# Patient Record
Sex: Female | Born: 1945 | Race: White | Hispanic: No | Smoking: Never smoker
Health system: Southern US, Community
[De-identification: ages and names within clinical notes are randomized; demographics above are authoritative.]

## PROBLEM LIST (undated history)

## (undated) DIAGNOSIS — C801 Malignant (primary) neoplasm, unspecified: Secondary | ICD-10-CM

## (undated) DIAGNOSIS — F32A Depression, unspecified: Secondary | ICD-10-CM

## (undated) DIAGNOSIS — F329 Major depressive disorder, single episode, unspecified: Secondary | ICD-10-CM

## (undated) DIAGNOSIS — M1990 Unspecified osteoarthritis, unspecified site: Secondary | ICD-10-CM

## (undated) DIAGNOSIS — R52 Pain, unspecified: Secondary | ICD-10-CM

## (undated) HISTORY — PX: ABDOMINAL HYSTERECTOMY: SHX81

## (undated) HISTORY — PX: HERNIA REPAIR: SHX51

## (undated) HISTORY — PX: FRACTURE SURGERY: SHX138

## (undated) HISTORY — PX: FOOT SURGERY: SHX648

---

## 1992-09-13 HISTORY — PX: BREAST CYST ASPIRATION: SHX578

## 2004-11-19 ENCOUNTER — Ambulatory Visit: Payer: Self-pay | Admitting: Internal Medicine

## 2004-11-24 ENCOUNTER — Ambulatory Visit: Payer: Self-pay | Admitting: Internal Medicine

## 2005-07-14 ENCOUNTER — Ambulatory Visit: Payer: Self-pay | Admitting: Unknown Physician Specialty

## 2006-05-10 ENCOUNTER — Ambulatory Visit: Payer: Self-pay | Admitting: Internal Medicine

## 2007-09-13 ENCOUNTER — Ambulatory Visit: Payer: Self-pay | Admitting: Internal Medicine

## 2008-10-17 ENCOUNTER — Ambulatory Visit: Payer: Self-pay | Admitting: Internal Medicine

## 2009-05-17 ENCOUNTER — Emergency Department: Payer: Self-pay | Admitting: Emergency Medicine

## 2009-05-23 ENCOUNTER — Ambulatory Visit: Payer: Self-pay | Admitting: Unknown Physician Specialty

## 2009-10-21 ENCOUNTER — Ambulatory Visit: Payer: Self-pay | Admitting: Internal Medicine

## 2010-08-27 ENCOUNTER — Encounter
Admission: RE | Admit: 2010-08-27 | Discharge: 2010-08-27 | Payer: Self-pay | Source: Home / Self Care | Attending: Unknown Physician Specialty | Admitting: Unknown Physician Specialty

## 2010-09-02 ENCOUNTER — Encounter
Admission: RE | Admit: 2010-09-02 | Discharge: 2010-09-02 | Payer: Self-pay | Source: Home / Self Care | Attending: Unknown Physician Specialty | Admitting: Unknown Physician Specialty

## 2010-10-22 ENCOUNTER — Ambulatory Visit: Payer: Self-pay | Admitting: Internal Medicine

## 2010-11-02 ENCOUNTER — Ambulatory Visit: Payer: Self-pay | Admitting: Internal Medicine

## 2010-12-14 ENCOUNTER — Other Ambulatory Visit: Payer: Self-pay | Admitting: Unknown Physician Specialty

## 2010-12-14 DIAGNOSIS — M542 Cervicalgia: Secondary | ICD-10-CM

## 2010-12-15 ENCOUNTER — Ambulatory Visit
Admission: RE | Admit: 2010-12-15 | Discharge: 2010-12-15 | Disposition: A | Discharge status: Home / Self Care | Payer: BC Managed Care – PPO | Source: Ambulatory Visit | Attending: *Deleted | Admitting: *Deleted

## 2010-12-15 DIAGNOSIS — M542 Cervicalgia: Secondary | ICD-10-CM

## 2011-12-22 ENCOUNTER — Ambulatory Visit: Payer: Self-pay | Admitting: Internal Medicine

## 2013-02-14 ENCOUNTER — Ambulatory Visit: Payer: Self-pay | Admitting: Internal Medicine

## 2014-03-18 ENCOUNTER — Ambulatory Visit: Payer: Self-pay | Admitting: Family Medicine

## 2014-04-19 ENCOUNTER — Ambulatory Visit: Payer: Self-pay

## 2014-04-22 ENCOUNTER — Ambulatory Visit: Payer: Self-pay | Admitting: Unknown Physician Specialty

## 2014-04-24 LAB — PATHOLOGY REPORT

## 2015-02-17 ENCOUNTER — Other Ambulatory Visit: Payer: Self-pay | Admitting: Family Medicine

## 2015-02-17 DIAGNOSIS — Z1231 Encounter for screening mammogram for malignant neoplasm of breast: Secondary | ICD-10-CM

## 2015-03-12 ENCOUNTER — Other Ambulatory Visit: Payer: Self-pay | Admitting: Orthopaedic Surgery

## 2015-03-12 DIAGNOSIS — M47817 Spondylosis without myelopathy or radiculopathy, lumbosacral region: Secondary | ICD-10-CM

## 2015-03-20 ENCOUNTER — Other Ambulatory Visit: Payer: Self-pay | Admitting: Family Medicine

## 2015-03-20 ENCOUNTER — Ambulatory Visit
Admission: RE | Admit: 2015-03-20 | Discharge: 2015-03-20 | Disposition: A | Discharge status: Home / Self Care | Payer: Medicare Other | Source: Ambulatory Visit | Attending: Family Medicine | Admitting: Family Medicine

## 2015-03-20 DIAGNOSIS — Z1231 Encounter for screening mammogram for malignant neoplasm of breast: Secondary | ICD-10-CM

## 2015-03-20 HISTORY — DX: Malignant (primary) neoplasm, unspecified: C80.1

## 2015-03-26 ENCOUNTER — Ambulatory Visit
Admission: RE | Admit: 2015-03-26 | Discharge: 2015-03-26 | Disposition: A | Discharge status: Home / Self Care | Payer: Medicare Other | Source: Ambulatory Visit | Attending: Orthopaedic Surgery | Admitting: Orthopaedic Surgery

## 2015-03-26 ENCOUNTER — Other Ambulatory Visit: Payer: Self-pay

## 2015-03-26 DIAGNOSIS — M4806 Spinal stenosis, lumbar region: Secondary | ICD-10-CM | POA: Insufficient documentation

## 2015-03-26 DIAGNOSIS — M47817 Spondylosis without myelopathy or radiculopathy, lumbosacral region: Secondary | ICD-10-CM

## 2015-03-26 DIAGNOSIS — M5126 Other intervertebral disc displacement, lumbar region: Secondary | ICD-10-CM | POA: Insufficient documentation

## 2015-03-26 DIAGNOSIS — M5136 Other intervertebral disc degeneration, lumbar region: Secondary | ICD-10-CM | POA: Diagnosis not present

## 2016-02-23 ENCOUNTER — Other Ambulatory Visit: Payer: Self-pay | Admitting: Family Medicine

## 2016-02-23 DIAGNOSIS — Z1231 Encounter for screening mammogram for malignant neoplasm of breast: Secondary | ICD-10-CM

## 2016-02-26 ENCOUNTER — Encounter: Payer: Self-pay | Admitting: Emergency Medicine

## 2016-02-26 ENCOUNTER — Emergency Department
Admission: EM | Admit: 2016-02-26 | Discharge: 2016-02-26 | Disposition: A | Discharge status: Home / Self Care | Payer: Medicare Other | Attending: Emergency Medicine | Admitting: Emergency Medicine

## 2016-02-26 DIAGNOSIS — Z85828 Personal history of other malignant neoplasm of skin: Secondary | ICD-10-CM | POA: Diagnosis not present

## 2016-02-26 DIAGNOSIS — Y939 Activity, unspecified: Secondary | ICD-10-CM | POA: Insufficient documentation

## 2016-02-26 DIAGNOSIS — Y999 Unspecified external cause status: Secondary | ICD-10-CM | POA: Insufficient documentation

## 2016-02-26 DIAGNOSIS — W01198A Fall on same level from slipping, tripping and stumbling with subsequent striking against other object, initial encounter: Secondary | ICD-10-CM | POA: Diagnosis not present

## 2016-02-26 DIAGNOSIS — S0181XA Laceration without foreign body of other part of head, initial encounter: Secondary | ICD-10-CM | POA: Insufficient documentation

## 2016-02-26 DIAGNOSIS — Y929 Unspecified place or not applicable: Secondary | ICD-10-CM | POA: Insufficient documentation

## 2016-02-26 MED ORDER — SULFAMETHOXAZOLE-TRIMETHOPRIM 800-160 MG PO TABS: 1.0000 | ORAL_TABLET | Freq: Two times a day (BID) | ORAL | Status: AC

## 2016-02-26 NOTE — ED Notes (Signed)
Unable to access e-signature - pt aware

## 2016-02-26 NOTE — ED Notes (Signed)
Pt states hit forehead on mantle last night. States tetanus is utd

## 2016-02-26 NOTE — ED Notes (Signed)
Pt states she tripped over the ironing board and fell hitting her head last night, no loc. Pt noted to have laceration to forehead. No bleeding noted.

## 2016-02-26 NOTE — ED Provider Notes (Signed)
[facility] Emergency Department Provider Note   ____________________________________________  Time seen: Approximately 11:50 AM  I have reviewed the triage vital signs and the nursing notes.   HISTORY  Chief Complaint Head Laceration    HPI Tara Bentley is a 70 y.o. female patient presents with 40 laceration which occurred approximately 18 hours ago. Patient states she tripped and fell hitting her forehead on a metal last night. Patient state there was no LOC and does not have any headache. Patient also denies any vertigo or vision disturbance. Patient say hemorrhaging is controlled with direct pressure. Patient does take aspirin daily. She denies any pain at this time. No other palliative measures this complaint.  Past Medical History  Diagnosis Date  . Cancer ([place])     skin    There are no active problems to display for this patient.   Past Surgical History  Procedure Laterality Date  . Breast cyst aspiration Left 1994    negative    Current Outpatient Rx  Name  Route  Sig  Dispense  Refill  . sulfamethoxazole-trimethoprim (BACTRIM DS,SEPTRA DS) 800-160 MG tablet   Oral   Take 1 tablet by mouth 2 (two) times daily.   20 tablet   0     Allergies Review of patient's allergies indicates no known allergies.  Family History  Problem Relation Age of Onset  . Brain cancer Father 28    Social History Social History  Substance Use Topics  . Smoking status: Never Smoker   . Smokeless tobacco: None  . Alcohol Use: No    Review of Systems Constitutional: No fever/chills Eyes: No visual changes. ENT: No sore throat. Cardiovascular: Denies chest pain. Respiratory: Denies shortness of breath. Gastrointestinal: No abdominal pain.  No nausea, no vomiting.  No diarrhea.  No constipation. Genitourinary: Negative for dysuria. Musculoskeletal: Negative for back pain. Skin: Negative for rash. Forehead laceration Neurological: Negative  for headaches, focal weakness or numbness.  .  ____________________________________________   PHYSICAL EXAM:  VITAL SIGNS: ED Triage Vitals  Enc Vitals Group     BP 02/26/16 1135 145/90 mmHg     Pulse Rate 02/26/16 1135 78     Resp 02/26/16 1135 18     Temp 02/26/16 1135 98.4 F (36.9 C)     Temp Source 02/26/16 1135 Oral     SpO2 02/26/16 1135 97 %     Weight 02/26/16 1135 105 lb (47.628 kg)     Height 02/26/16 1135 5\' 1"  (1.549 m)     Head Cir --      Peak Flow --      Pain Score --      Pain Loc --      Pain Edu? --      Excl. in [place]? --     Constitutional: Alert and oriented. Well appearing and in no acute distress. Eyes: Conjunctivae are normal. PERRL. EOMI. Head: Atraumatic. Nose: No congestion/rhinnorhea. Mouth/Throat: Mucous membranes are moist.  Oropharynx non-erythematous. Neck: No stridor.  *No cervical spine tenderness to palpation. Hematological/Lymphatic/Immunilogical: No cervical lymphadenopathy. Cardiovascular: Normal rate, regular rhythm. Grossly normal heart sounds.  Good peripheral circulation. Respiratory: Normal respiratory effort.  No retractions. Lungs CTAB. Gastrointestinal: Soft and nontender. No distention. No abdominal bruits. No CVA tenderness. Musculoskeletal: No lower extremity tenderness nor edema.  No joint effusions. Neurologic:  Normal speech and language. No gross focal neurologic deficits are appreciated. No gait instability. Skin: 2 cm vertical laceration center forehead. Psychiatric: Mood and affect are  normal. Speech and behavior are normal.  ____________________________________________   LABS (all labs ordered are listed, but only abnormal results are displayed)  Labs Reviewed - No data to display ____________________________________________  EKG   ____________________________________________  RADIOLOGY   ____________________________________________   PROCEDURES  Procedure(s) performed: None  Critical Care  performed: No  ____________________________________________   INITIAL IMPRESSION / ASSESSMENT AND PLAN / ED COURSE  Pertinent labs & imaging results that were available during my care of the patient were reviewed by me and considered in my medical decision making (see chart for details).  Facial laceration greater than 18 hours. Discussed the patient rash and now cannot close wound at this time. Area was cleaned and irrigated and Steri-Strips were applied. Patient given discharge care instructions. Patient a prescription for Bactrim DS. Patient advised to follow-up with family doctor. ____________________________________________   FINAL CLINICAL IMPRESSION(S) / ED DIAGNOSES  Final diagnoses:  Facial laceration, initial encounter      NEW MEDICATIONS STARTED DURING THIS VISIT:  New Prescriptions   SULFAMETHOXAZOLE-TRIMETHOPRIM (BACTRIM DS,SEPTRA DS) 800-160 MG TABLET    Take 1 tablet by mouth 2 (two) times daily.     Note:  This document was prepared using Dragon voice recognition software and may include unintentional dictation errors.    Sable Feil, PA-C 02/26/16 [place], MD 02/26/16 [phone]

## 2016-02-26 NOTE — Discharge Instructions (Signed)
Take medication as directed Facial Laceration A facial laceration is a cut on the face. These injuries can be painful and cause bleeding. Some cuts may need to be closed with stitches (sutures), skin adhesive strips, or wound glue. Cuts usually heal quickly but can leave a scar. It can take 1-2 years for the scar to go away completely. HOME CARE   Only take medicines as told by your doctor.  Follow your doctor's instructions for wound care. For Stitches:  Keep the cut clean and dry.  If you have a bandage (dressing), change it at least once a day. Change the bandage if it gets wet or dirty, or as told by your doctor.  Wash the cut with soap and water 2 times a day. Rinse the cut with water. Pat it dry with a clean towel.  Put a thin layer of medicated cream on the cut as told by your doctor.  You may shower after the first 24 hours. Do not soak the cut in water until the stitches are removed.  Have your stitches removed as told by your doctor.  Do not wear any makeup until a few days after your stitches are removed. For Skin Adhesive Strips:  Keep the cut clean and dry.  Do not get the strips wet. You may take a bath, but be careful to keep the cut dry.  If the cut gets wet, pat it dry with a clean towel.  The strips will fall off on their own. Do not remove the strips that are still stuck to the cut. For Wound Glue:  You may shower or take baths. Do not soak or scrub the cut. Do not swim. Avoid heavy sweating until the glue falls off on its own. After a shower or bath, pat the cut dry with a clean towel.  Do not put medicine or makeup on your cut until the glue falls off.  If you have a bandage, do not put tape over the glue.  Avoid lots of sunlight or tanning lamps until the glue falls off.  The glue will fall off on its own in 5-10 days. Do not pick at the glue. After Healing:  Put sunscreen on the cut for the first year to reduce your scar. GET HELP IF:  You have a  fever. GET HELP RIGHT AWAY IF:   Your cut area gets red, painful, or puffy (swollen).  You see a yellowish-white fluid (pus) coming from the cut.   This information is not intended to replace advice given to you by your health care provider. Make sure you discuss any questions you have with your health care provider.   Document Released: 02/16/2008 Document Revised: 09/20/2014 Document Reviewed: 04/12/2013 Elsevier Interactive Patient Education [employer].

## 2016-03-25 ENCOUNTER — Ambulatory Visit
Admission: RE | Admit: 2016-03-25 | Discharge: 2016-03-25 | Disposition: A | Discharge status: Home / Self Care | Payer: Medicare Other | Source: Ambulatory Visit | Attending: Family Medicine | Admitting: Family Medicine

## 2016-03-25 ENCOUNTER — Other Ambulatory Visit: Payer: Self-pay | Admitting: Family Medicine

## 2016-03-25 DIAGNOSIS — Z1231 Encounter for screening mammogram for malignant neoplasm of breast: Secondary | ICD-10-CM

## 2016-03-25 DIAGNOSIS — R921 Mammographic calcification found on diagnostic imaging of breast: Secondary | ICD-10-CM | POA: Diagnosis not present

## 2016-03-29 ENCOUNTER — Other Ambulatory Visit: Payer: Self-pay | Admitting: Family Medicine

## 2016-03-29 DIAGNOSIS — R921 Mammographic calcification found on diagnostic imaging of breast: Secondary | ICD-10-CM

## 2016-03-29 DIAGNOSIS — N6489 Other specified disorders of breast: Secondary | ICD-10-CM

## 2016-04-01 ENCOUNTER — Ambulatory Visit
Admission: RE | Admit: 2016-04-01 | Discharge: 2016-04-01 | Disposition: A | Discharge status: Home / Self Care | Payer: Medicare Other | Source: Ambulatory Visit | Attending: Family Medicine | Admitting: Family Medicine

## 2016-04-01 DIAGNOSIS — R921 Mammographic calcification found on diagnostic imaging of breast: Secondary | ICD-10-CM | POA: Insufficient documentation

## 2016-04-01 DIAGNOSIS — N6489 Other specified disorders of breast: Secondary | ICD-10-CM

## 2016-04-06 ENCOUNTER — Other Ambulatory Visit: Payer: Self-pay | Admitting: Family Medicine

## 2016-04-06 DIAGNOSIS — N6320 Unspecified lump in the left breast, unspecified quadrant: Secondary | ICD-10-CM

## 2016-04-06 DIAGNOSIS — R921 Mammographic calcification found on diagnostic imaging of breast: Secondary | ICD-10-CM

## 2016-04-08 ENCOUNTER — Other Ambulatory Visit: Payer: PRIVATE HEALTH INSURANCE

## 2016-04-08 ENCOUNTER — Ambulatory Visit: Payer: PRIVATE HEALTH INSURANCE

## 2016-04-14 ENCOUNTER — Ambulatory Visit
Admission: RE | Admit: 2016-04-14 | Discharge: 2016-04-14 | Disposition: A | Discharge status: Home / Self Care | Payer: Medicare Other | Source: Ambulatory Visit | Attending: Family Medicine | Admitting: Family Medicine

## 2016-04-14 ENCOUNTER — Other Ambulatory Visit: Payer: Self-pay | Admitting: Family Medicine

## 2016-04-14 DIAGNOSIS — N6489 Other specified disorders of breast: Secondary | ICD-10-CM | POA: Insufficient documentation

## 2016-04-14 DIAGNOSIS — N6320 Unspecified lump in the left breast, unspecified quadrant: Secondary | ICD-10-CM

## 2016-04-14 DIAGNOSIS — R921 Mammographic calcification found on diagnostic imaging of breast: Secondary | ICD-10-CM | POA: Insufficient documentation

## 2016-08-30 ENCOUNTER — Other Ambulatory Visit: Payer: Self-pay | Admitting: Family Medicine

## 2016-08-30 DIAGNOSIS — R921 Mammographic calcification found on diagnostic imaging of breast: Secondary | ICD-10-CM

## 2016-10-15 ENCOUNTER — Other Ambulatory Visit: Payer: Self-pay | Admitting: Family Medicine

## 2016-10-15 ENCOUNTER — Ambulatory Visit
Admission: RE | Admit: 2016-10-15 | Discharge: 2016-10-15 | Disposition: A | Discharge status: Home / Self Care | Payer: Medicare Other | Source: Ambulatory Visit | Attending: Family Medicine | Admitting: Family Medicine

## 2016-10-15 DIAGNOSIS — R921 Mammographic calcification found on diagnostic imaging of breast: Secondary | ICD-10-CM | POA: Insufficient documentation

## 2016-10-18 ENCOUNTER — Other Ambulatory Visit: Payer: Self-pay | Admitting: Family Medicine

## 2016-10-18 DIAGNOSIS — R928 Other abnormal and inconclusive findings on diagnostic imaging of breast: Secondary | ICD-10-CM

## 2016-10-18 DIAGNOSIS — R921 Mammographic calcification found on diagnostic imaging of breast: Secondary | ICD-10-CM

## 2016-11-03 ENCOUNTER — Ambulatory Visit
Admission: RE | Admit: 2016-11-03 | Discharge: 2016-11-03 | Disposition: A | Discharge status: Home / Self Care | Payer: Medicare Other | Source: Ambulatory Visit | Attending: Family Medicine | Admitting: Family Medicine

## 2016-11-03 DIAGNOSIS — N6012 Diffuse cystic mastopathy of left breast: Secondary | ICD-10-CM | POA: Insufficient documentation

## 2016-11-03 DIAGNOSIS — R928 Other abnormal and inconclusive findings on diagnostic imaging of breast: Secondary | ICD-10-CM

## 2016-11-03 DIAGNOSIS — R921 Mammographic calcification found on diagnostic imaging of breast: Secondary | ICD-10-CM | POA: Diagnosis present

## 2016-11-03 HISTORY — PX: BREAST BIOPSY: SHX20

## 2016-11-04 LAB — SURGICAL PATHOLOGY

## 2017-03-08 ENCOUNTER — Encounter: Payer: Self-pay | Admitting: *Deleted

## 2017-03-15 ENCOUNTER — Ambulatory Visit
Admission: RE | Admit: 2017-03-15 | Discharge: 2017-03-15 | Disposition: A | Discharge status: Home / Self Care | Payer: Medicare Other | Source: Ambulatory Visit | Attending: Ophthalmology | Admitting: Ophthalmology

## 2017-03-15 ENCOUNTER — Encounter: Payer: Self-pay | Admitting: *Deleted

## 2017-03-15 ENCOUNTER — Ambulatory Visit: Payer: Medicare Other | Admitting: Anesthesiology

## 2017-03-15 ENCOUNTER — Encounter
Admission: RE | Disposition: A | Discharge status: Home / Self Care | Payer: Self-pay | Source: Ambulatory Visit | Attending: Ophthalmology

## 2017-03-15 DIAGNOSIS — Z7982 Long term (current) use of aspirin: Secondary | ICD-10-CM | POA: Insufficient documentation

## 2017-03-15 DIAGNOSIS — E7800 Pure hypercholesterolemia, unspecified: Secondary | ICD-10-CM | POA: Insufficient documentation

## 2017-03-15 DIAGNOSIS — F329 Major depressive disorder, single episode, unspecified: Secondary | ICD-10-CM | POA: Insufficient documentation

## 2017-03-15 DIAGNOSIS — Z79899 Other long term (current) drug therapy: Secondary | ICD-10-CM | POA: Insufficient documentation

## 2017-03-15 DIAGNOSIS — H2511 Age-related nuclear cataract, right eye: Secondary | ICD-10-CM | POA: Diagnosis present

## 2017-03-15 HISTORY — DX: Pain, unspecified: R52

## 2017-03-15 HISTORY — PX: CATARACT EXTRACTION W/PHACO: SHX586

## 2017-03-15 HISTORY — DX: Unspecified osteoarthritis, unspecified site: M19.90

## 2017-03-15 HISTORY — DX: Major depressive disorder, single episode, unspecified: F32.9

## 2017-03-15 HISTORY — DX: Depression, unspecified: F32.A

## 2017-03-15 SURGERY — PHACOEMULSIFICATION, CATARACT, WITH IOL INSERTION
Anesthesia: Monitor Anesthesia Care | Site: Eye | Laterality: Right | Wound class: Clean

## 2017-03-15 MED ORDER — POVIDONE-IODINE 5 % OP SOLN
OPHTHALMIC | Status: AC
  Filled 2017-03-15: qty 30

## 2017-03-15 MED ORDER — CARBACHOL 0.01 % IO SOLN
INTRAOCULAR | Status: DC | PRN
  Administered 2017-03-15: .5 mL via INTRAOCULAR

## 2017-03-15 MED ORDER — NA CHONDROIT SULF-NA HYALURON 40-17 MG/ML IO SOLN
INTRAOCULAR | Status: AC
  Filled 2017-03-15: qty 1

## 2017-03-15 MED ORDER — MOXIFLOXACIN HCL 0.5 % OP SOLN: 1.0000 [drp] | OPHTHALMIC | Status: DC | PRN

## 2017-03-15 MED ORDER — FENTANYL CITRATE (PF) 100 MCG/2ML IJ SOLN
INTRAMUSCULAR | Status: AC
  Filled 2017-03-15: qty 2

## 2017-03-15 MED ORDER — EPINEPHRINE PF 1 MG/ML IJ SOLN
INTRAMUSCULAR | Status: AC
  Filled 2017-03-15: qty 1

## 2017-03-15 MED ORDER — MIDAZOLAM HCL 2 MG/2ML IJ SOLN
INTRAMUSCULAR | Status: DC | PRN
  Administered 2017-03-15: 1 mg via INTRAVENOUS

## 2017-03-15 MED ORDER — MOXIFLOXACIN HCL 0.5 % OP SOLN
OPHTHALMIC | Status: AC
  Filled 2017-03-15: qty 3

## 2017-03-15 MED ORDER — FENTANYL CITRATE (PF) 100 MCG/2ML IJ SOLN
INTRAMUSCULAR | Status: DC | PRN
  Administered 2017-03-15: 50 ug via INTRAVENOUS

## 2017-03-15 MED ORDER — NA CHONDROIT SULF-NA HYALURON 40-17 MG/ML IO SOLN
INTRAOCULAR | Status: DC | PRN
Start: 2017-03-15 — End: 2017-03-15
  Administered 2017-03-15: 1 mL via INTRAOCULAR

## 2017-03-15 MED ORDER — ARMC OPHTHALMIC DILATING DROPS
OPHTHALMIC | Status: AC
  Administered 2017-03-15: 1 via OPHTHALMIC
  Filled 2017-03-15: qty 0.4

## 2017-03-15 MED ORDER — SODIUM CHLORIDE 0.9 % IV SOLN
INTRAVENOUS | Status: DC
  Administered 2017-03-15: 11:00:00 via INTRAVENOUS

## 2017-03-15 MED ORDER — ARMC OPHTHALMIC DILATING DROPS
1.0000 "application " | OPHTHALMIC | Status: AC
  Administered 2017-03-15 (×3): 1 via OPHTHALMIC

## 2017-03-15 MED ORDER — LIDOCAINE HCL (PF) 4 % IJ SOLN
INTRAOCULAR | Status: DC | PRN
  Administered 2017-03-15: 2 mL via OPHTHALMIC

## 2017-03-15 MED ORDER — LIDOCAINE HCL (PF) 4 % IJ SOLN
INTRAMUSCULAR | Status: AC
  Filled 2017-03-15: qty 5

## 2017-03-15 MED ORDER — MIDAZOLAM HCL 2 MG/2ML IJ SOLN
INTRAMUSCULAR | Status: AC
  Filled 2017-03-15: qty 2

## 2017-03-15 MED ORDER — MOXIFLOXACIN HCL 0.5 % OP SOLN
OPHTHALMIC | Status: DC | PRN
Start: 2017-03-15 — End: 2017-03-15
  Administered 2017-03-15: .2 mL via OPHTHALMIC

## 2017-03-15 MED ORDER — BSS IO SOLN
INTRAOCULAR | Status: DC | PRN
  Administered 2017-03-15: 1 mL via OPHTHALMIC

## 2017-03-15 MED ORDER — POVIDONE-IODINE 5 % OP SOLN
OPHTHALMIC | Status: DC | PRN
Start: 2017-03-15 — End: 2017-03-15
  Administered 2017-03-15: 1 via OPHTHALMIC

## 2017-03-15 SURGICAL SUPPLY — 14 items
GLOVE BIO SURGEON STRL SZ8 (GLOVE) ×3 IMPLANT
GLOVE BIOGEL M 6.5 STRL (GLOVE) ×3 IMPLANT
GLOVE SURG LX 8.0 MICRO (GLOVE) ×2
GLOVE SURG LX STRL 8.0 MICRO (GLOVE) ×1 IMPLANT
GOWN STRL REUS W/ TWL LRG LVL3 (GOWN DISPOSABLE) ×2 IMPLANT
GOWN STRL REUS W/TWL LRG LVL3 (GOWN DISPOSABLE) ×4
LENS IOL TECNIS ITEC 15.5 (Intraocular Lens) ×3 IMPLANT
PACK CATARACT (MISCELLANEOUS) ×3 IMPLANT
PACK CATARACT BRASINGTON LX (MISCELLANEOUS) ×3 IMPLANT
SOL BSS BAG (MISCELLANEOUS) ×3
SOLUTION BSS BAG (MISCELLANEOUS) ×1 IMPLANT
SYR 5ML LL (SYRINGE) ×3 IMPLANT
WATER STERILE IRR 250ML POUR (IV SOLUTION) ×3 IMPLANT
WIPE NON LINTING 3.25X3.25 (MISCELLANEOUS) ×3 IMPLANT

## 2017-03-15 NOTE — Op Note (Signed)
PREOPERATIVE DIAGNOSIS:  Nuclear sclerotic cataract of the right eye.   POSTOPERATIVE DIAGNOSIS:  nuclear sclerotic cataract right eye   OPERATIVE PROCEDURE: Procedure(s): CATARACT EXTRACTION PHACO AND INTRAOCULAR LENS PLACEMENT (IOC)   SURGEON:  Birder Robson, MD.   ANESTHESIA:  Anesthesiologist: Piscitello, Precious Haws, MD CRNA: Hedda Slade, CRNA  1.      Managed anesthesia care. 2.      0.25ml of Shugarcaine was instilled in the eye following the paracentesis.   COMPLICATIONS:  None.   TECHNIQUE:   Stop and chop   DESCRIPTION OF PROCEDURE:  The patient was examined and consented in the preoperative holding area where the aforementioned topical anesthesia was applied to the right eye and then brought back to the Operating Room where the right eye was prepped and draped in the usual sterile ophthalmic fashion and a lid speculum was placed. A paracentesis was created with the side port blade and the anterior chamber was filled with viscoelastic. A near clear corneal incision was performed with the steel keratome. A continuous curvilinear capsulorrhexis was performed with a cystotome followed by the capsulorrhexis forceps. Hydrodissection and hydrodelineation were carried out with BSS on a blunt cannula. The lens was removed in a stop and chop  technique and the remaining cortical material was removed with the irrigation-aspiration handpiece. The capsular bag was inflated with viscoelastic and the Technis ZCB00  lens was placed in the capsular bag without complication. The remaining viscoelastic was removed from the eye with the irrigation-aspiration handpiece. The wounds were hydrated. The anterior chamber was flushed with Miostat and the eye was inflated to physiologic pressure. 0.4ml of Vigamox was placed in the anterior chamber. The wounds were found to be water tight. The eye was dressed with Vigamox. The patient was given protective glasses to wear throughout the day and a shield with which  to sleep tonight. The patient was also given drops with which to begin a drop regimen today and will follow-up with me in one day.  Implant Name Type Inv. Item Serial No. Manufacturer Lot No. LRB No. Used  LENS IOL DIOP 15.5 - [id number] Intraocular Lens LENS IOL DIOP 15.5 [id number]   Right 1   Procedure(s) with comments: CATARACT EXTRACTION PHACO AND INTRAOCULAR LENS PLACEMENT (IOC) (Right) - [place] 00:36 AP% 14.1 CDE 5.06 Fluid pack lot # [id number]  Electronically signed: [place] 03/15/2017 12:29 PM

## 2017-03-15 NOTE — Anesthesia Postprocedure Evaluation (Signed)
Anesthesia Post Note  Patient: Tara Bentley  Procedure(s) Performed: Procedure(s) (LRB): CATARACT EXTRACTION PHACO AND INTRAOCULAR LENS PLACEMENT (IOC) (Right)  Patient location during evaluation: PACU Anesthesia Type: MAC Level of consciousness: awake Pain management: pain level controlled Vital Signs Assessment: post-procedure vital signs reviewed and stable Respiratory status: spontaneous breathing Cardiovascular status: blood pressure returned to baseline Postop Assessment: no signs of nausea or vomiting Anesthetic complications: no     Last Vitals:  Vitals:   03/15/17 1104 03/15/17 1231  BP: 124/71 128/71  Pulse: 72 70  Resp: 20 12  Temp: (!) 35.9 C 37 C    Last Pain:  Vitals:   03/15/17 1231  TempSrc: Oral                 Hedda Slade

## 2017-03-15 NOTE — H&P (Signed)
All labs reviewed. Abnormal studies sent to patients PCP when indicated.  Previous H&P reviewed, patient examined, there are NO CHANGES.  Tara Bentley LOUIS7/3/201812:05 PM

## 2017-03-15 NOTE — Discharge Instructions (Signed)
Eye Surgery Discharge Instructions  Expect mild scratchy sensation or mild soreness. DO NOT RUB YOUR EYE!  The day of surgery:  Minimal physical activity, but bed rest is not required  No reading, computer work, or close hand work  No bending, lifting, or straining.  May watch TV  For 24 hours:  No driving, legal decisions, or alcoholic beverages  Safety precautions  Eat anything you prefer: It is better to start with liquids, then soup then solid foods.  _____ Eye patch should be worn until postoperative exam tomorrow.  ____ Solar shield eyeglasses should be worn for comfort in the sunlight/patch while sleeping  Resume all regular medications including aspirin or Coumadin if these were discontinued prior to surgery. You may shower, bathe, shave, or wash your hair. Tylenol may be taken for mild discomfort.  Call your doctor if you experience significant pain, nausea, or vomiting, fever > 101 or other signs of infection. [phone] or [phone] Specific instructions:  Follow-up Information    Birder Robson, MD Follow up on 03/15/2017.   Specialty:  Ophthalmology Why:  follow up at 4:10 pm today Contact information: [place] [address] [phone]

## 2017-03-15 NOTE — Anesthesia Post-op Follow-up Note (Cosign Needed)
Anesthesia QCDR form completed.        

## 2017-03-15 NOTE — Transfer of Care (Signed)
Immediate Anesthesia Transfer of Care Note  Patient: Tara Bentley  Procedure(s) Performed: Procedure(s) with comments: CATARACT EXTRACTION PHACO AND INTRAOCULAR LENS PLACEMENT (IOC) (Right) - [place] 00:36 AP% 14.1 CDE 5.06 Fluid pack lot # [id number]  Patient Location: PACU  Anesthesia Type:MAC  Level of Consciousness: awake, alert  and oriented  Airway & Oxygen Therapy: Patient Spontanous Breathing  Post-op Assessment: Report given to RN and Post -op Vital signs reviewed and stable  Post vital signs: Reviewed and stable  Last Vitals:  Vitals:   03/15/17 1104 03/15/17 1231  BP: 124/71 128/71  Pulse: 72 70  Resp: 20 12  Temp: (!) 35.9 C 37 C    Last Pain:  Vitals:   03/15/17 1231  TempSrc: Oral         Complications: No apparent anesthesia complications

## 2017-03-15 NOTE — Anesthesia Preprocedure Evaluation (Signed)
Anesthesia Evaluation  Patient identified by MRN, date of birth, ID band Patient awake    Reviewed: Allergy & Precautions, H&P , NPO status , Patient's Chart, lab work & pertinent test results  Airway Mallampati: III  TM Distance: <3 FB Neck ROM: limited    Dental  (+) Chipped, Caps   Pulmonary neg pulmonary ROS, neg shortness of breath,           Cardiovascular Exercise Tolerance: Good (-) angina(-) Past MI negative cardio ROS       Neuro/Psych PSYCHIATRIC DISORDERS Depression negative neurological ROS  negative psych ROS   GI/Hepatic negative GI ROS, Neg liver ROS, neg GERD  ,  Endo/Other  negative endocrine ROS  Renal/GU      Musculoskeletal  (+) Arthritis ,   Abdominal   Peds  Hematology negative hematology ROS (+)   Anesthesia Other Findings Past Medical History: No date: Arthritis No date: Cancer ([place])     Comment: skin No date: Depression No date: Pain     Comment: CHRONIC BACK  Past Surgical History: No date: ABDOMINAL HYSTERECTOMY 11/03/2016: BREAST BIOPSY Left     Comment: stereoe path pending 1994: BREAST CYST ASPIRATION Left     Comment: negative No date: FRACTURE SURGERY     Comment: RT ARM No date: HERNIA REPAIR  BMI    Body Mass Index:  20.41 kg/m      Reproductive/Obstetrics negative OB ROS                             Anesthesia Physical Anesthesia Plan  ASA: II  Anesthesia Plan: MAC   Post-op Pain Management:    Induction: Intravenous  PONV Risk Score and Plan:   Airway Management Planned: Natural Airway and Nasal Cannula  Additional Equipment:   Intra-op Plan:   Post-operative Plan:   Informed Consent: I have reviewed the patients History and Physical, chart, labs and discussed the procedure including the risks, benefits and alternatives for the proposed anesthesia with the patient or authorized representative who has indicated his/her  understanding and acceptance.   Dental Advisory Given  Plan Discussed with: Anesthesiologist, CRNA and Surgeon  Anesthesia Plan Comments: (Patient consented for risks of anesthesia including but not limited to:  - adverse reactions to medications - damage to teeth, lips or other oral mucosa - sore throat or hoarseness - Damage to heart, brain, lungs or loss of life  Patient voiced understanding.)        Anesthesia Quick Evaluation

## 2017-04-06 ENCOUNTER — Encounter: Payer: Self-pay | Admitting: *Deleted

## 2017-04-12 ENCOUNTER — Ambulatory Visit
Admission: RE | Admit: 2017-04-12 | Discharge: 2017-04-12 | Disposition: A | Discharge status: Home / Self Care | Payer: Medicare Other | Source: Ambulatory Visit | Attending: Ophthalmology | Admitting: Ophthalmology

## 2017-04-12 ENCOUNTER — Encounter: Payer: Self-pay | Admitting: *Deleted

## 2017-04-12 ENCOUNTER — Encounter
Admission: RE | Disposition: A | Discharge status: Home / Self Care | Payer: Self-pay | Source: Ambulatory Visit | Attending: Ophthalmology

## 2017-04-12 ENCOUNTER — Ambulatory Visit: Payer: Medicare Other | Admitting: Anesthesiology

## 2017-04-12 DIAGNOSIS — Z85828 Personal history of other malignant neoplasm of skin: Secondary | ICD-10-CM | POA: Insufficient documentation

## 2017-04-12 DIAGNOSIS — F329 Major depressive disorder, single episode, unspecified: Secondary | ICD-10-CM | POA: Diagnosis not present

## 2017-04-12 DIAGNOSIS — G8929 Other chronic pain: Secondary | ICD-10-CM | POA: Insufficient documentation

## 2017-04-12 DIAGNOSIS — M1990 Unspecified osteoarthritis, unspecified site: Secondary | ICD-10-CM | POA: Diagnosis not present

## 2017-04-12 DIAGNOSIS — H2512 Age-related nuclear cataract, left eye: Secondary | ICD-10-CM | POA: Diagnosis present

## 2017-04-12 HISTORY — PX: CATARACT EXTRACTION W/PHACO: SHX586

## 2017-04-12 SURGERY — PHACOEMULSIFICATION, CATARACT, WITH IOL INSERTION
Anesthesia: Monitor Anesthesia Care | Site: Eye | Laterality: Left | Wound class: Clean

## 2017-04-12 MED ORDER — FENTANYL CITRATE (PF) 100 MCG/2ML IJ SOLN
INTRAMUSCULAR | Status: AC
  Filled 2017-04-12: qty 2

## 2017-04-12 MED ORDER — MIDAZOLAM HCL 2 MG/2ML IJ SOLN
INTRAMUSCULAR | Status: DC | PRN
Start: 2017-04-12 — End: 2017-04-12
  Administered 2017-04-12: 0.5 mg via INTRAVENOUS

## 2017-04-12 MED ORDER — SODIUM CHLORIDE 0.9 % IV SOLN
INTRAVENOUS | Status: DC
  Administered 2017-04-12: 09:00:00 via INTRAVENOUS

## 2017-04-12 MED ORDER — FENTANYL CITRATE (PF) 100 MCG/2ML IJ SOLN
INTRAMUSCULAR | Status: DC | PRN
  Administered 2017-04-12: 50 ug via INTRAVENOUS

## 2017-04-12 MED ORDER — MOXIFLOXACIN HCL 0.5 % OP SOLN: 1.0000 [drp] | OPHTHALMIC | Status: DC | PRN

## 2017-04-12 MED ORDER — CARBACHOL 0.01 % IO SOLN
INTRAOCULAR | Status: DC | PRN
  Administered 2017-04-12: .5 mL via INTRAOCULAR

## 2017-04-12 MED ORDER — NA CHONDROIT SULF-NA HYALURON 40-17 MG/ML IO SOLN
INTRAOCULAR | Status: DC | PRN
  Administered 2017-04-12: 1 mL via INTRAOCULAR

## 2017-04-12 MED ORDER — EPINEPHRINE PF 1 MG/ML IJ SOLN
INTRAOCULAR | Status: DC | PRN
  Administered 2017-04-12: 1 mL via OPHTHALMIC

## 2017-04-12 MED ORDER — MOXIFLOXACIN HCL 0.5 % OP SOLN
OPHTHALMIC | Status: AC
  Filled 2017-04-12: qty 3

## 2017-04-12 MED ORDER — LIDOCAINE HCL (PF) 4 % IJ SOLN
INTRAMUSCULAR | Status: DC | PRN
  Administered 2017-04-12: 2 mL via OPHTHALMIC

## 2017-04-12 MED ORDER — MOXIFLOXACIN HCL 0.5 % OP SOLN
OPHTHALMIC | Status: DC | PRN
  Administered 2017-04-12: .2 mL via OPHTHALMIC

## 2017-04-12 MED ORDER — POVIDONE-IODINE 5 % OP SOLN
OPHTHALMIC | Status: DC | PRN
  Administered 2017-04-12: 1 via OPHTHALMIC

## 2017-04-12 MED ORDER — MIDAZOLAM HCL 2 MG/2ML IJ SOLN
INTRAMUSCULAR | Status: AC
  Filled 2017-04-12: qty 2

## 2017-04-12 MED ORDER — ARMC OPHTHALMIC DILATING DROPS
1.0000 "application " | OPHTHALMIC | Status: AC
  Administered 2017-04-12 (×2): 1 via OPHTHALMIC

## 2017-04-12 MED ORDER — ARMC OPHTHALMIC DILATING DROPS
OPHTHALMIC | Status: AC
  Administered 2017-04-12: 1 via OPHTHALMIC
  Filled 2017-04-12: qty 0.4

## 2017-04-12 SURGICAL SUPPLY — 16 items
GLOVE BIO SURGEON STRL SZ8 (GLOVE) ×3 IMPLANT
GLOVE BIOGEL M 6.5 STRL (GLOVE) ×3 IMPLANT
GLOVE SURG LX 8.0 MICRO (GLOVE) ×2
GLOVE SURG LX STRL 8.0 MICRO (GLOVE) ×1 IMPLANT
GOWN STRL REUS W/ TWL LRG LVL3 (GOWN DISPOSABLE) ×2 IMPLANT
GOWN STRL REUS W/TWL LRG LVL3 (GOWN DISPOSABLE) ×4
LABEL CATARACT MEDS ST (LABEL) ×3 IMPLANT
LENS IOL TECNIS ITEC 15.5 (Intraocular Lens) ×3 IMPLANT
PACK CATARACT (MISCELLANEOUS) ×3 IMPLANT
PACK CATARACT BRASINGTON LX (MISCELLANEOUS) ×3 IMPLANT
PACK EYE AFTER SURG (MISCELLANEOUS) ×3 IMPLANT
SOL BSS BAG (MISCELLANEOUS) ×3
SOLUTION BSS BAG (MISCELLANEOUS) ×1 IMPLANT
SYR 5ML LL (SYRINGE) ×3 IMPLANT
WATER STERILE IRR 250ML POUR (IV SOLUTION) ×3 IMPLANT
WIPE NON LINTING 3.25X3.25 (MISCELLANEOUS) ×3 IMPLANT

## 2017-04-12 NOTE — Anesthesia Post-op Follow-up Note (Cosign Needed)
Anesthesia QCDR form completed.        

## 2017-04-12 NOTE — Anesthesia Postprocedure Evaluation (Signed)
Anesthesia Post Note  Patient: Tara Bentley  Procedure(s) Performed: Procedure(s) (LRB): CATARACT EXTRACTION PHACO AND INTRAOCULAR LENS PLACEMENT (IOC) (Left)  Patient location during evaluation: PACU Anesthesia Type: MAC Level of consciousness: awake and alert Pain management: pain level controlled Vital Signs Assessment: post-procedure vital signs reviewed and stable Respiratory status: spontaneous breathing, nonlabored ventilation, respiratory function stable and patient connected to nasal cannula oxygen Cardiovascular status: stable and blood pressure returned to baseline Anesthetic complications: no     Last Vitals:  Vitals:   04/12/17 1029 04/12/17 1033  BP: 130/74 130/74  Pulse: 73 76  Resp: 16 16  Temp: 36.9 C     Last Pain:  Vitals:   04/12/17 1033  TempSrc: Oral  PainSc: 0-No pain                 Darlyne Russian

## 2017-04-12 NOTE — Op Note (Signed)
PREOPERATIVE DIAGNOSIS:  Nuclear sclerotic cataract of the left eye.   POSTOPERATIVE DIAGNOSIS:  Nuclear sclerotic cataract of the left eye.   OPERATIVE PROCEDURE: Procedure(s): CATARACT EXTRACTION PHACO AND INTRAOCULAR LENS PLACEMENT (IOC)   SURGEON:  Birder Robson, MD.   ANESTHESIA:  Anesthesiologist: Piscitello, Precious Haws, MD CRNA: Darlyne Russian, CRNA  1.      Managed anesthesia care. 2.     0.33ml of Shugarcaine was instilled following the paracentesis   COMPLICATIONS:  None.   TECHNIQUE:   Stop and chop   DESCRIPTION OF PROCEDURE:  The patient was examined and consented in the preoperative holding area where the aforementioned topical anesthesia was applied to the left eye and then brought back to the Operating Room where the left eye was prepped and draped in the usual sterile ophthalmic fashion and a lid speculum was placed. A paracentesis was created with the side port blade and the anterior chamber was filled with viscoelastic. A near clear corneal incision was performed with the steel keratome. A continuous curvilinear capsulorrhexis was performed with a cystotome followed by the capsulorrhexis forceps. Hydrodissection and hydrodelineation were carried out with BSS on a blunt cannula. The lens was removed in a stop and chop  technique and the remaining cortical material was removed with the irrigation-aspiration handpiece. The capsular bag was inflated with viscoelastic and the Technis ZCB00 lens was placed in the capsular bag without complication. The remaining viscoelastic was removed from the eye with the irrigation-aspiration handpiece. The wounds were hydrated. The anterior chamber was flushed with Miostat and the eye was inflated to physiologic pressure. 0.58ml Vigamox was placed in the anterior chamber. The wounds were found to be water tight. The eye was dressed with Vigamox. The patient was given protective glasses to wear throughout the day and a shield with which to sleep  tonight. The patient was also given drops with which to begin a drop regimen today and will follow-up with me in one day.  Implant Name Type Inv. Item Serial No. Manufacturer Lot No. LRB No. Used  LENS IOL DIOP 15.5 - [id number] Intraocular Lens LENS IOL DIOP 15.5 [id number] AMO   Left 1    Procedure(s) with comments: CATARACT EXTRACTION PHACO AND INTRAOCULAR LENS PLACEMENT (IOC) (Left) - [place] 00:30 AP% 18.9 CDE 5.80 fluid pack lot # [id number]  Electronically signed: [place] 04/12/2017 10:30 AM

## 2017-04-12 NOTE — Transfer of Care (Signed)
Immediate Anesthesia Transfer of Care Note  Patient: Tara Bentley  Procedure(s) Performed: Procedure(s) with comments: CATARACT EXTRACTION PHACO AND INTRAOCULAR LENS PLACEMENT (IOC) (Left) - [place] 00:30 AP% 18.9 CDE 5.80 fluid pack lot # [id number]  Patient Location: PACU  Anesthesia Type:MAC  Level of Consciousness: awake, alert  and oriented  Airway & Oxygen Therapy: Patient Spontanous Breathing  Post-op Assessment: Report given to RN and Post -op Vital signs reviewed and stable  Post vital signs: Reviewed and stable  Last Vitals:  Vitals:   04/12/17 1029 04/12/17 1033  BP: 130/74 130/74  Pulse: 73 76  Resp: 16 16  Temp: 36.9 C     Last Pain:  Vitals:   04/12/17 1033  TempSrc: Oral  PainSc: 0-No pain         Complications: No apparent anesthesia complications

## 2017-04-12 NOTE — H&P (Signed)
All labs reviewed. Abnormal studies sent to patients PCP when indicated.  Previous H&P reviewed, patient examined, there are NO CHANGES.  Tara Bentley LOUIS7/31/201810:04 AM

## 2017-04-12 NOTE — Anesthesia Procedure Notes (Signed)
Procedure Name: MAC Date/Time: 04/12/2017 10:11 AM Performed by: Darlyne Russian Pre-anesthesia Checklist: Patient identified, Emergency Drugs available, Suction available, Patient being monitored and Timeout performed Patient Re-evaluated:Patient Re-evaluated prior to induction Oxygen Delivery Method: Nasal cannula Placement Confirmation: positive ETCO2

## 2017-04-12 NOTE — Anesthesia Preprocedure Evaluation (Signed)
Anesthesia Evaluation  Patient identified by MRN, date of birth, ID band Patient awake    Reviewed: Allergy & Precautions, H&P , NPO status , Patient's Chart, lab work & pertinent test results  Airway Mallampati: III  TM Distance: <3 FB Neck ROM: limited    Dental  (+) Chipped, Caps   Pulmonary neg pulmonary ROS, neg shortness of breath,           Cardiovascular Exercise Tolerance: Good (-) angina(-) Past MI negative cardio ROS       Neuro/Psych PSYCHIATRIC DISORDERS Depression negative neurological ROS  negative psych ROS   GI/Hepatic negative GI ROS, Neg liver ROS, neg GERD  ,  Endo/Other  negative endocrine ROS  Renal/GU      Musculoskeletal  (+) Arthritis ,   Abdominal   Peds  Hematology negative hematology ROS (+)   Anesthesia Other Findings Past Medical History: No date: Arthritis No date: Cancer ([facility])     Comment: skin No date: Depression No date: Pain     Comment: CHRONIC BACK  Past Surgical History: No date: ABDOMINAL HYSTERECTOMY 11/03/2016: BREAST BIOPSY Left     Comment: stereoe path pending 1994: BREAST CYST ASPIRATION Left     Comment: negative No date: FRACTURE SURGERY     Comment: RT ARM No date: HERNIA REPAIR  BMI    Body Mass Index:  20.41 kg/m      Reproductive/Obstetrics negative OB ROS                             Anesthesia Physical Anesthesia Plan  ASA: II  Anesthesia Plan: MAC   Post-op Pain Management:    Induction: Intravenous  PONV Risk Score and Plan:   Airway Management Planned: Natural Airway and Nasal Cannula  Additional Equipment:   Intra-op Plan:   Post-operative Plan:   Informed Consent: I have reviewed the patients History and Physical, chart, labs and discussed the procedure including the risks, benefits and alternatives for the proposed anesthesia with the patient or authorized representative who has indicated his/her  understanding and acceptance.   Dental Advisory Given  Plan Discussed with: Anesthesiologist, CRNA and Surgeon  Anesthesia Plan Comments: (Patient consented for risks of anesthesia including but not limited to:  - adverse reactions to medications - damage to teeth, lips or other oral mucosa - sore throat or hoarseness - Damage to heart, brain, lungs or loss of life  Patient voiced understanding.)        Anesthesia Quick Evaluation  

## 2017-04-12 NOTE — Discharge Instructions (Signed)

## 2017-09-27 ENCOUNTER — Other Ambulatory Visit: Payer: Self-pay | Admitting: Family Medicine

## 2017-09-27 DIAGNOSIS — Z1231 Encounter for screening mammogram for malignant neoplasm of breast: Secondary | ICD-10-CM

## 2017-10-13 ENCOUNTER — Ambulatory Visit
Admission: RE | Admit: 2017-10-13 | Discharge: 2017-10-13 | Disposition: A | Discharge status: Home / Self Care | Payer: Medicare Other | Source: Ambulatory Visit | Attending: Family Medicine | Admitting: Family Medicine

## 2017-10-13 DIAGNOSIS — Z1231 Encounter for screening mammogram for malignant neoplasm of breast: Secondary | ICD-10-CM | POA: Diagnosis present

## 2018-09-04 ENCOUNTER — Other Ambulatory Visit: Payer: Self-pay | Admitting: Family Medicine

## 2018-09-04 DIAGNOSIS — Z1231 Encounter for screening mammogram for malignant neoplasm of breast: Secondary | ICD-10-CM

## 2018-10-16 ENCOUNTER — Ambulatory Visit
Admission: RE | Admit: 2018-10-16 | Discharge: 2018-10-16 | Disposition: A | Discharge status: Home / Self Care | Payer: Medicare Other | Source: Ambulatory Visit | Attending: Family Medicine | Admitting: Family Medicine

## 2018-10-16 DIAGNOSIS — Z1231 Encounter for screening mammogram for malignant neoplasm of breast: Secondary | ICD-10-CM | POA: Insufficient documentation

## 2019-06-29 ENCOUNTER — Other Ambulatory Visit: Payer: Self-pay

## 2019-06-29 ENCOUNTER — Other Ambulatory Visit
Admission: RE | Admit: 2019-06-29 | Discharge: 2019-06-29 | Disposition: A | Discharge status: Home / Self Care | Payer: Medicare Other | Source: Ambulatory Visit | Attending: Internal Medicine | Admitting: Internal Medicine

## 2019-06-29 DIAGNOSIS — Z20828 Contact with and (suspected) exposure to other viral communicable diseases: Secondary | ICD-10-CM | POA: Diagnosis not present

## 2019-06-29 DIAGNOSIS — Z01812 Encounter for preprocedural laboratory examination: Secondary | ICD-10-CM | POA: Diagnosis present

## 2019-06-30 LAB — SARS CORONAVIRUS 2 (TAT 6-24 HRS): SARS Coronavirus 2: NEGATIVE

## 2019-09-13 ENCOUNTER — Other Ambulatory Visit: Payer: Self-pay | Admitting: Family Medicine

## 2019-09-13 DIAGNOSIS — Z1231 Encounter for screening mammogram for malignant neoplasm of breast: Secondary | ICD-10-CM

## 2019-10-05 ENCOUNTER — Ambulatory Visit: Payer: Medicare Other | Attending: Internal Medicine

## 2019-10-05 DIAGNOSIS — Z23 Encounter for immunization: Secondary | ICD-10-CM | POA: Insufficient documentation

## 2019-10-05 NOTE — Progress Notes (Signed)
   Covid-19 Vaccination Clinic  Name:  Tara Bentley    MRN: [id number] DOB: 1946/07/07  10/05/2019  [name] was observed post Covid-19 immunization for 15 minutes without incidence. She was provided with Vaccine Information Sheet and instruction to access the V-Safe system.   [name] was instructed to call 911 with any severe reactions post vaccine: Marland Kitchen Difficulty breathing  . Swelling of your face and throat  . A fast heartbeat  . A bad rash all over your body  . Dizziness and weakness    Immunizations Administered    Name Date Dose VIS Date Route   Pfizer COVID-19 Vaccine 10/05/2019  4:57 PM 0.3 mL 08/24/2019 Intramuscular   Manufacturer: [place]   Lot: [id number]   [place]: [id number]

## 2019-10-23 ENCOUNTER — Ambulatory Visit
Admission: RE | Admit: 2019-10-23 | Discharge: 2019-10-23 | Disposition: A | Discharge status: Home / Self Care | Payer: Medicare Other | Source: Ambulatory Visit | Attending: Family Medicine | Admitting: Family Medicine

## 2019-10-23 DIAGNOSIS — Z1231 Encounter for screening mammogram for malignant neoplasm of breast: Secondary | ICD-10-CM | POA: Diagnosis present

## 2019-10-26 ENCOUNTER — Ambulatory Visit: Payer: Medicare Other | Attending: Internal Medicine

## 2019-10-26 DIAGNOSIS — Z23 Encounter for immunization: Secondary | ICD-10-CM | POA: Insufficient documentation

## 2019-10-26 NOTE — Progress Notes (Signed)
   Cov[facility] Vaccination [facility]  Name:  Tara Bentley    MRN: [id number] DOB: 1945/12/24  10/26/2019  Ms. Tara Bentley was observed post Covid-19 immunization for 15 minutes without incidence. She was provided with Vaccine Information Sheet and instruction to access the V-Safe system.   Tara Bentley was instructed to call 911 with any severe reactions post vaccine: Marland Kitchen Difficulty breathing  . Swelling of your face and throat  . A fast heartbeat  . A bad rash all over your body  . Dizziness and weakness    Immunizations Administered    Name Date Dose VIS Date Route   Pfizer COVID-19 Vaccine 10/26/2019  3:19 PM 0.3 mL 08/24/2019 Intramuscular   Manufacturer: [employer] [place]   Lot: [id number]   [place]: [id number]

## 2020-10-02 ENCOUNTER — Other Ambulatory Visit: Payer: Self-pay | Admitting: Family Medicine

## 2020-10-13 ENCOUNTER — Other Ambulatory Visit: Payer: Self-pay | Admitting: Family Medicine

## 2020-10-14 ENCOUNTER — Other Ambulatory Visit: Payer: Self-pay | Admitting: Family Medicine

## 2020-10-14 DIAGNOSIS — Z1231 Encounter for screening mammogram for malignant neoplasm of breast: Secondary | ICD-10-CM

## 2020-11-04 ENCOUNTER — Ambulatory Visit
Admission: RE | Admit: 2020-11-04 | Discharge: 2020-11-04 | Disposition: A | Discharge status: Home / Self Care | Payer: Medicare Other | Source: Ambulatory Visit | Attending: Family Medicine | Admitting: Family Medicine

## 2020-11-04 ENCOUNTER — Other Ambulatory Visit: Payer: Self-pay

## 2020-11-04 DIAGNOSIS — Z1231 Encounter for screening mammogram for malignant neoplasm of breast: Secondary | ICD-10-CM | POA: Diagnosis not present

## 2020-11-12 ENCOUNTER — Other Ambulatory Visit: Payer: Self-pay | Admitting: Family Medicine

## 2020-11-12 DIAGNOSIS — N6489 Other specified disorders of breast: Secondary | ICD-10-CM

## 2020-11-12 DIAGNOSIS — R928 Other abnormal and inconclusive findings on diagnostic imaging of breast: Secondary | ICD-10-CM

## 2020-11-20 ENCOUNTER — Ambulatory Visit
Admission: RE | Admit: 2020-11-20 | Discharge: 2020-11-20 | Disposition: A | Discharge status: Home / Self Care | Payer: Medicare Other | Source: Ambulatory Visit | Attending: Family Medicine | Admitting: Family Medicine

## 2020-11-20 ENCOUNTER — Other Ambulatory Visit: Payer: Self-pay

## 2020-11-20 DIAGNOSIS — R928 Other abnormal and inconclusive findings on diagnostic imaging of breast: Secondary | ICD-10-CM | POA: Insufficient documentation

## 2020-11-20 DIAGNOSIS — N6489 Other specified disorders of breast: Secondary | ICD-10-CM | POA: Diagnosis present

## 2020-11-25 ENCOUNTER — Other Ambulatory Visit: Payer: Self-pay | Admitting: Family Medicine

## 2020-11-25 DIAGNOSIS — N6320 Unspecified lump in the left breast, unspecified quadrant: Secondary | ICD-10-CM

## 2020-11-25 DIAGNOSIS — R928 Other abnormal and inconclusive findings on diagnostic imaging of breast: Secondary | ICD-10-CM

## 2020-12-02 ENCOUNTER — Other Ambulatory Visit: Payer: Self-pay

## 2020-12-02 ENCOUNTER — Other Ambulatory Visit: Payer: Self-pay | Admitting: Family Medicine

## 2020-12-02 ENCOUNTER — Ambulatory Visit
Admission: RE | Admit: 2020-12-02 | Discharge: 2020-12-02 | Disposition: A | Discharge status: Home / Self Care | Payer: Medicare Other | Source: Ambulatory Visit | Attending: Family Medicine | Admitting: Family Medicine

## 2020-12-02 DIAGNOSIS — N6320 Unspecified lump in the left breast, unspecified quadrant: Secondary | ICD-10-CM

## 2020-12-02 DIAGNOSIS — D242 Benign neoplasm of left breast: Secondary | ICD-10-CM | POA: Diagnosis not present

## 2020-12-02 DIAGNOSIS — R928 Other abnormal and inconclusive findings on diagnostic imaging of breast: Secondary | ICD-10-CM

## 2020-12-02 DIAGNOSIS — N6082 Other benign mammary dysplasias of left breast: Secondary | ICD-10-CM | POA: Insufficient documentation

## 2020-12-02 HISTORY — PX: BREAST BIOPSY: SHX20

## 2020-12-03 LAB — SURGICAL PATHOLOGY

## 2021-07-27 IMAGING — MG DIGITAL DIAGNOSTIC BILAT W/ TOMO W/ CAD
8 series · 8 of 24 positions shown · non-contrast
Comparison: Previous exam(s).

CLINICAL DATA: 74-year-old female presenting as a recall from
screening for possible bilateral asymmetries.

EXAM:
DIGITAL DIAGNOSTIC BILATERAL MAMMOGRAM WITH TOMOSYNTHESIS AND CAD;
ULTRASOUND LEFT BREAST LIMITED; ULTRASOUND RIGHT BREAST LIMITED
TECHNIQUE: Bilateral digital diagnostic mammography and breast tomosynthesis
was performed. The images were evaluated with computer-aided
detection.; Targeted ultrasound examination of the left breast was
performed; Targeted ultrasound examination of the right breast was
performed

[L ML synth-2D]
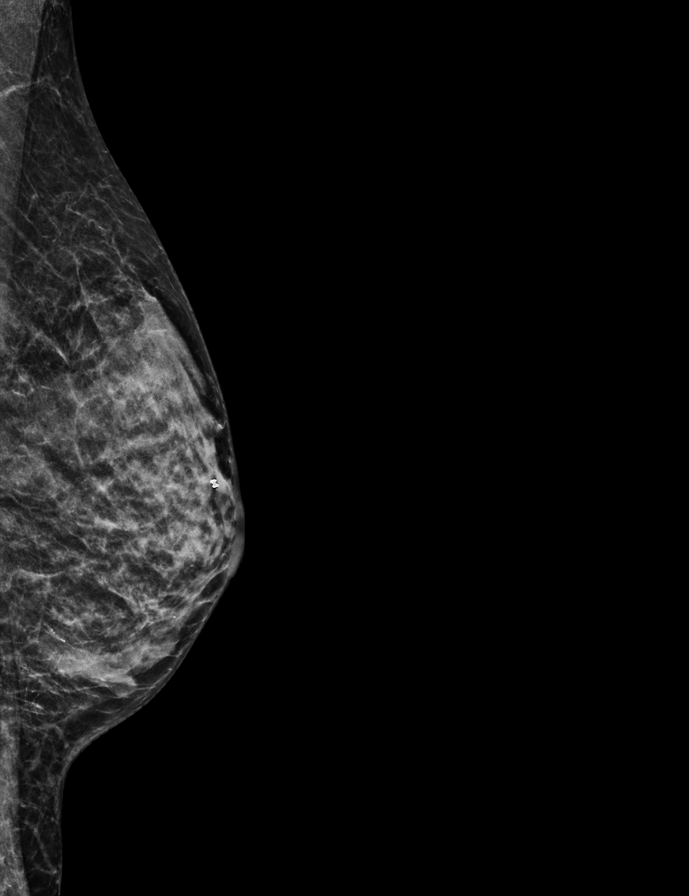

[R CC synth-2D]
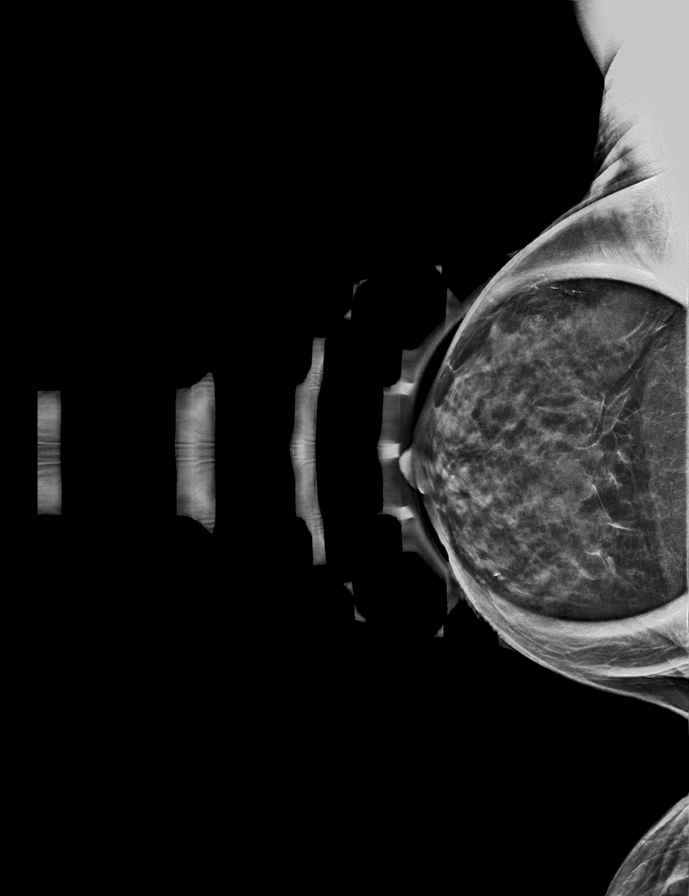

[R MLO synth-2D]
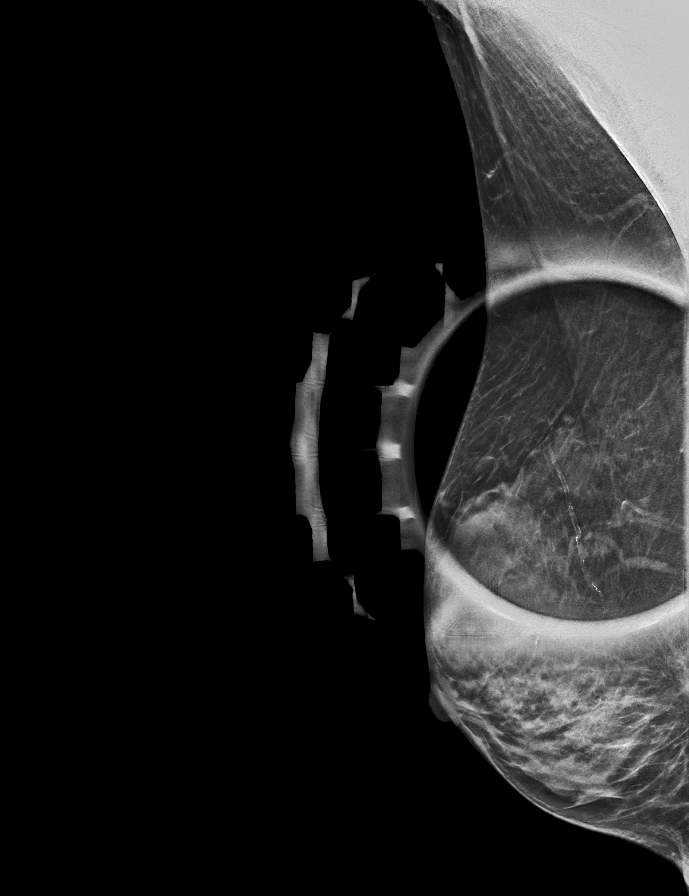

[L CC synth-2D]
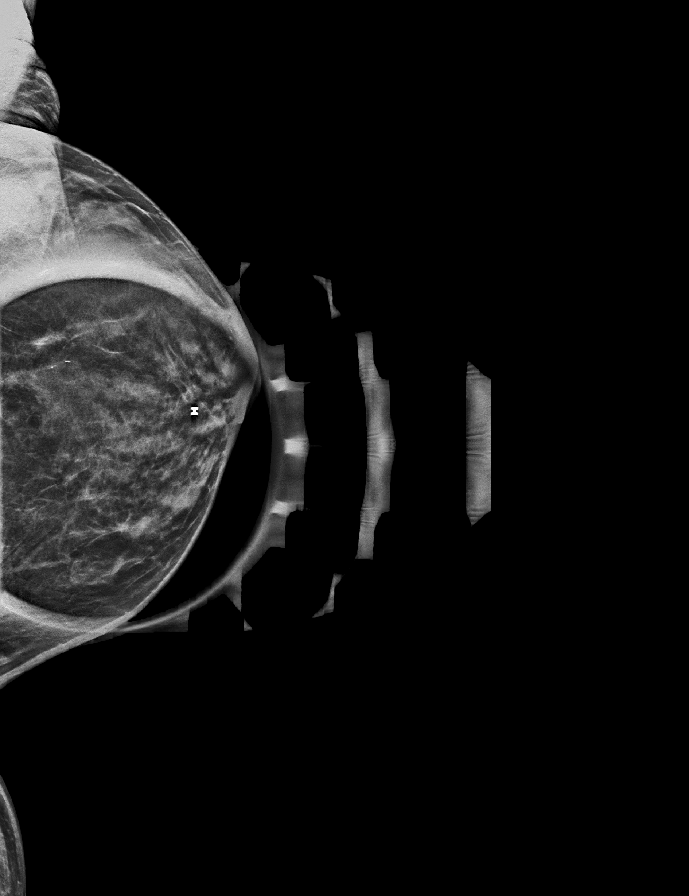

[L ML tomo · tomo slice 18/35.0]
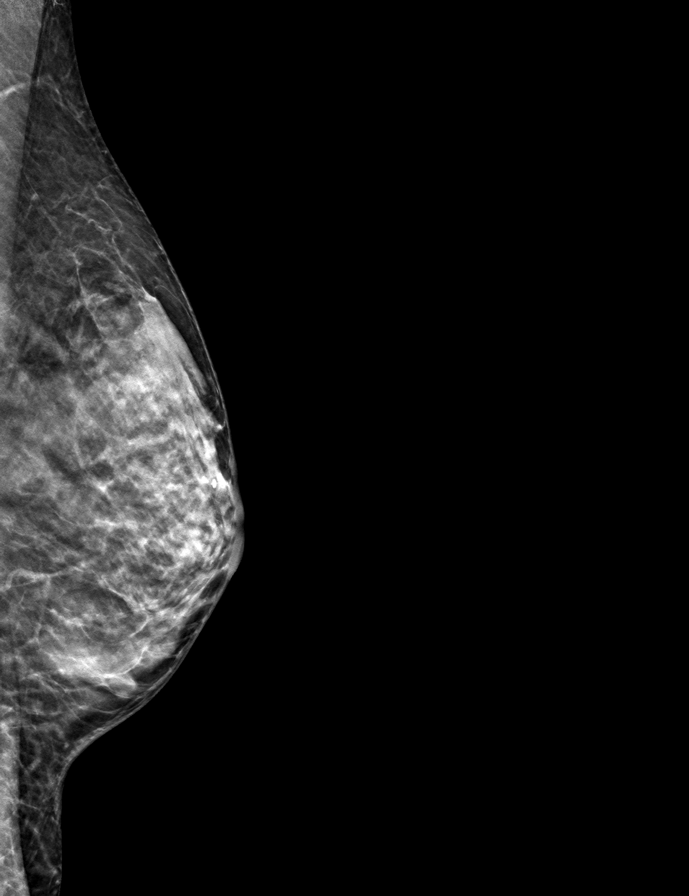

[R MLO tomo · tomo slice 21/40.0]
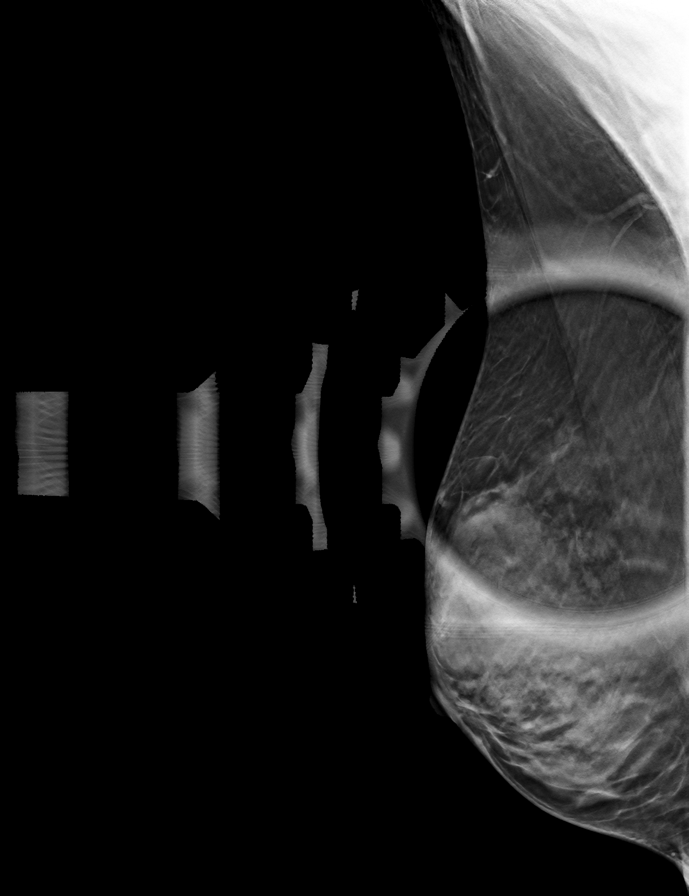

[L CC tomo · tomo slice 20/39.0]
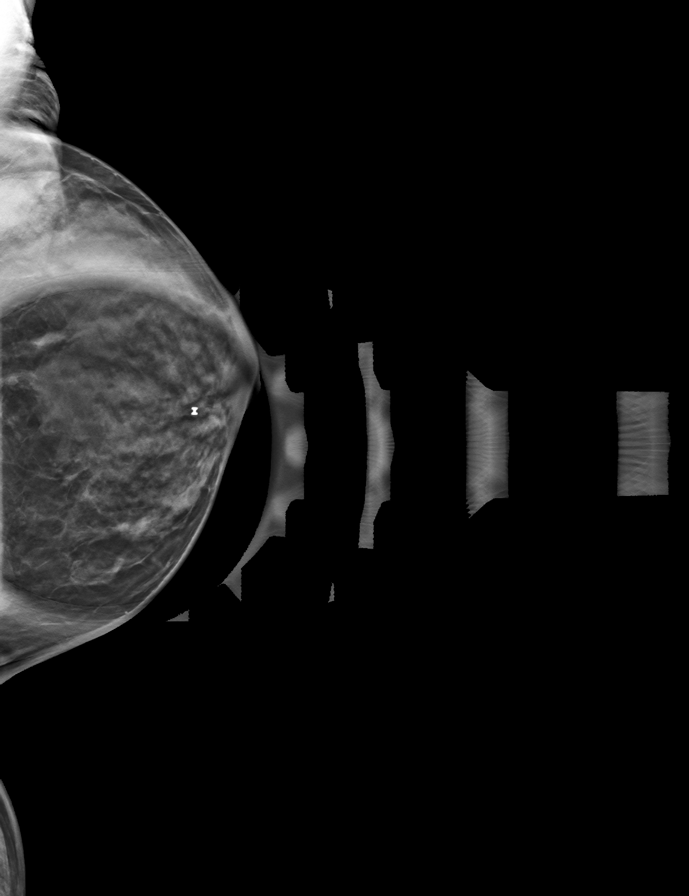

[R CC tomo · tomo slice 23/46.0]
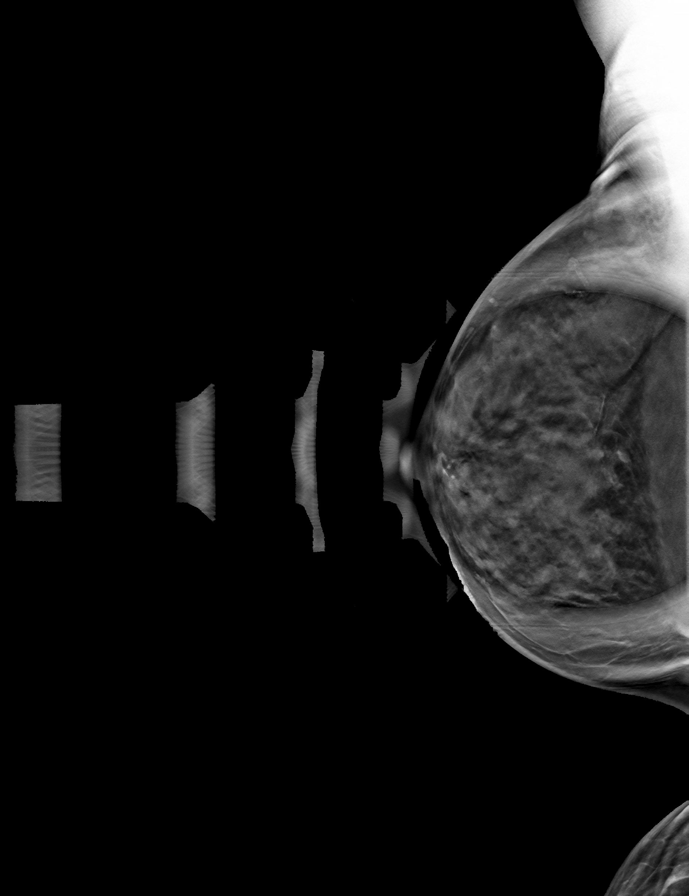

[8 of 24 positions shown; findings below may reference images not displayed]

ACR Breast Density Category d: The breast tissue is extremely dense,
which lowers the sensitivity of mammography.
FINDINGS: Mammogram:

Right breast: Spot compression tomosynthesis views of the right
breast were performed for a questioned asymmetry seen in the
superior central right breast. On the additional imaging the tissue
in this area disperses without a definite mass or distortion.

Left breast: Spot compression tomosynthesis cc views and full field
mL tomosynthesis views of the left breast were performed for a
questioned asymmetry seen only on the cc view in the medial aspect
of the left breast on the additional imaging the asymmetry is less
conspicuous however there is suggestion of a persistent obscured
mass in the medial aspect of the breast measuring approximately
cm.

Ultrasound:

Right breast: Targeted ultrasound performed throughout the superior
aspect of the right breast demonstrating dense tissue and scattered
mild fibrocystic changes. No suspicious cystic or solid mass.

Left breast: Targeted ultrasound performed the medial aspect of the
left breast demonstrating an oval circumscribed hypoechoic mass at 7
o'clock 2 cm from the nipple measuring 0.7 x 0.3 x 0.3 cm, possibly
a complicated cyst. This likely corresponds to the asymmetry seen
mammographically. No internal vascularity.

Targeted ultrasound of the left axilla demonstrates normal lymph
nodes.
IMPRESSION: 1. Benign dense tissue with mild fibrocystic change in the superior
right breast.

2. Indeterminate mass in the left breast at 7 o'clock, possibly a
complicated cyst.

RECOMMENDATION:
Ultrasound-guided core needle aspiration/possible biopsy of the left
breast mass at 7 o'clock.

I have discussed the findings and recommendations with the patient
who agrees to proceed with aspiration. The patient will be scheduled
for the procedure prior to leaving the office today.

BI-RADS CATEGORY  4: Suspicious.

## 2021-08-08 IMAGING — MG US BREAST BX W LOC DEV 1ST LESION IMG BX SPEC US GUIDE*L*
1 series · 8 of 8 positions shown · non-contrast
Comparison: Previous exam(s).
COMPARISON: Previous exam(s).

Addendum:
CLINICAL DATA: 7 mm indeterminate mass in the 7 o'clock position of
the left breast at recent mammography and ultrasound.

EXAM:
ULTRASOUND GUIDED LEFT BREAST CORE NEEDLE BIOPSY

[Series 1: MG view · 0.03mm/px · 8 of 19 slices shown]
[im 1/19]
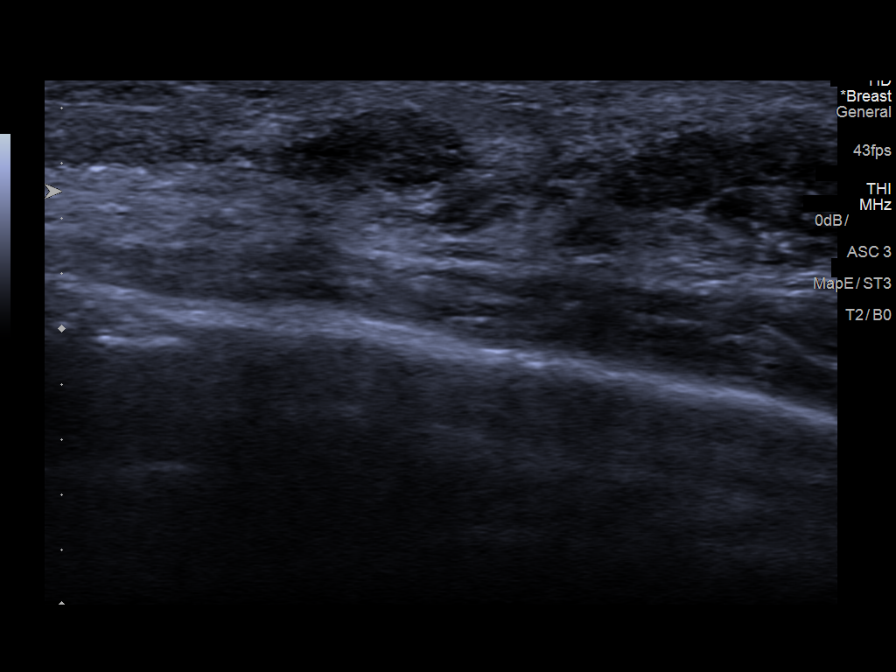
[im 3/19]
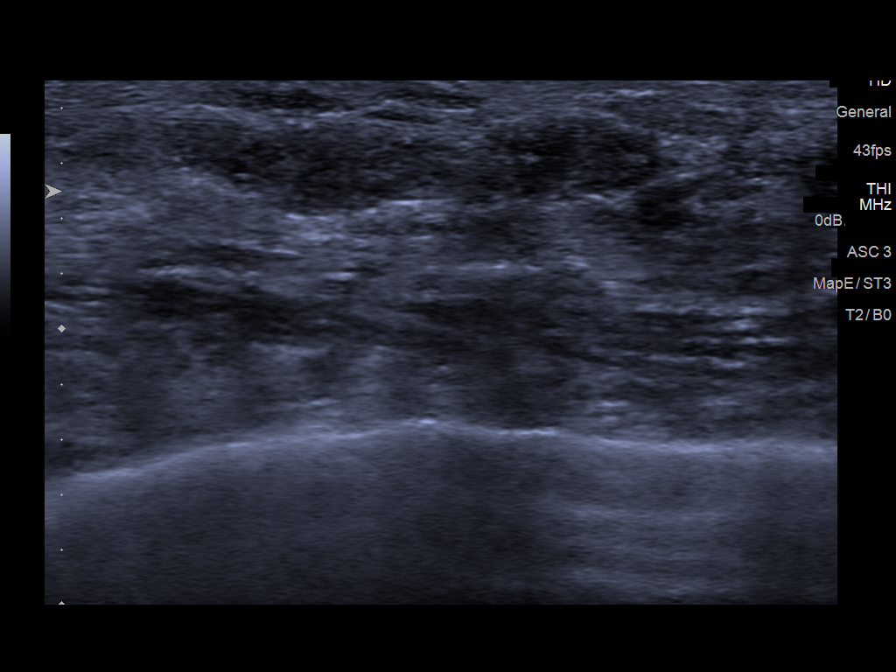
[im 6/19]
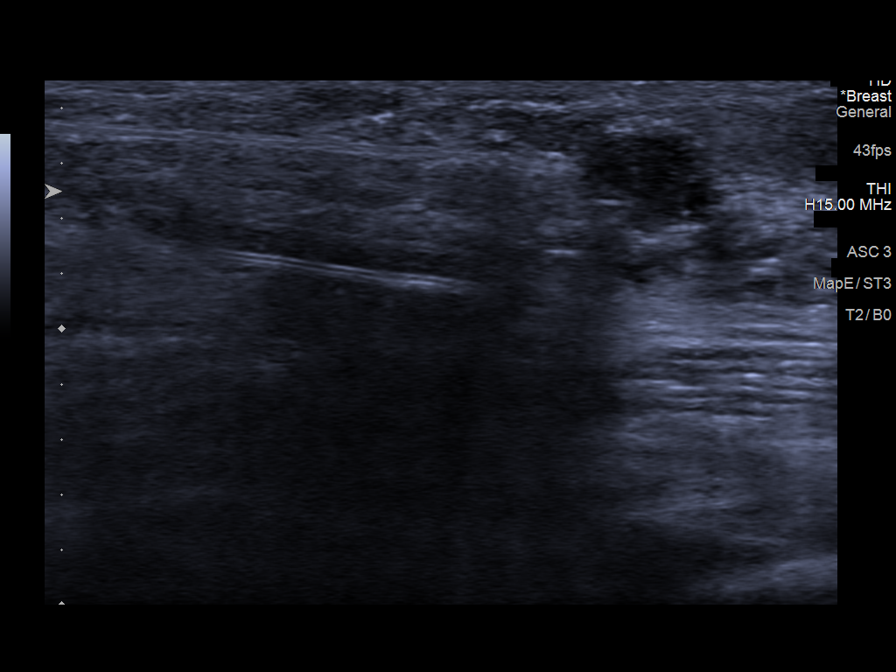
[im 8/19]
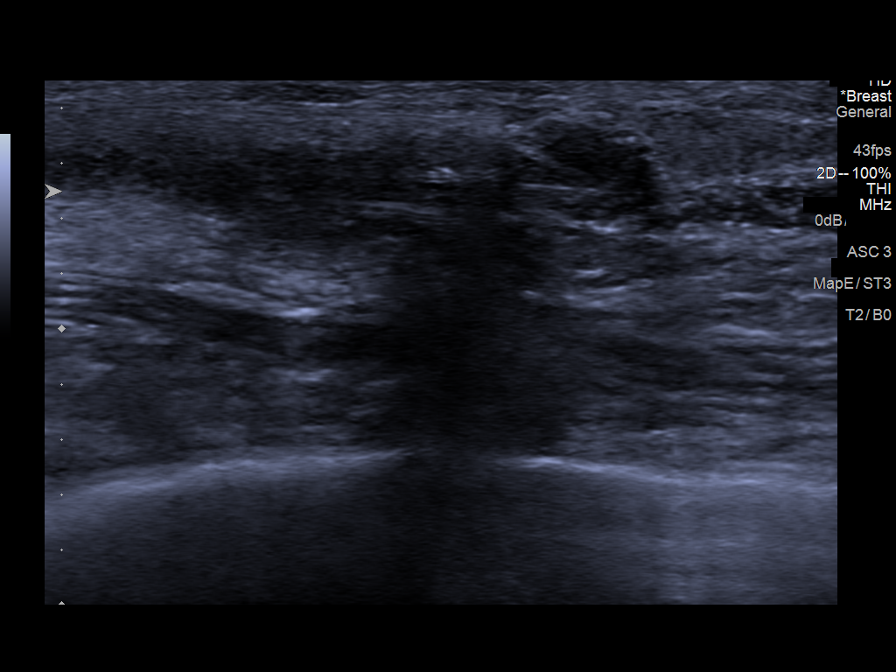
[im 11/19]
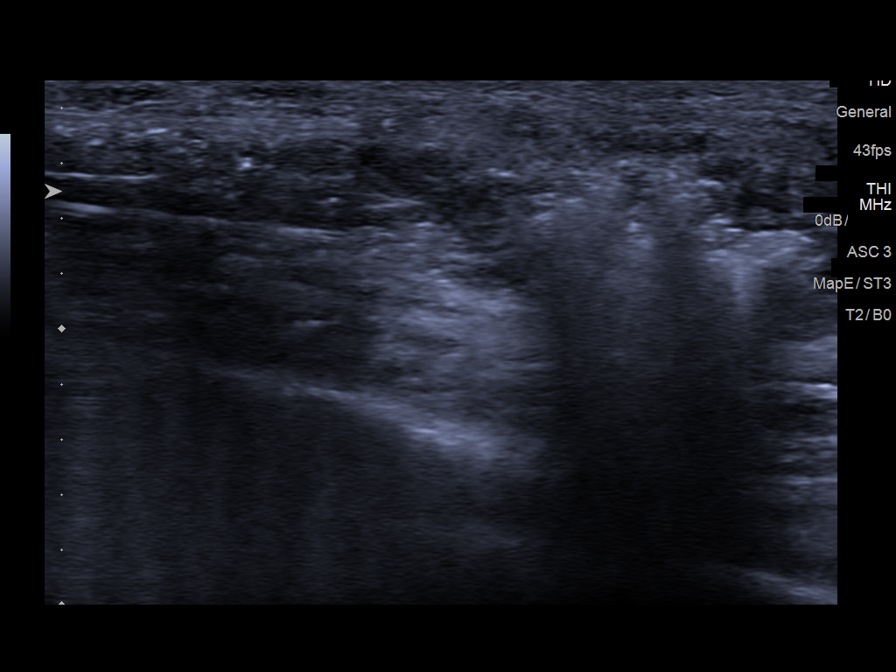
[im 13/19]
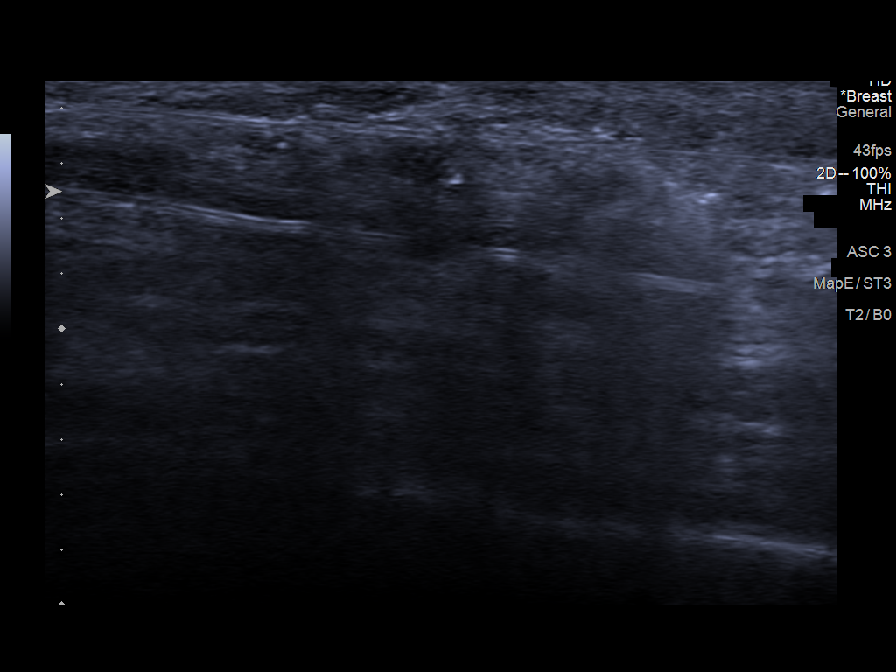
[im 16/19]
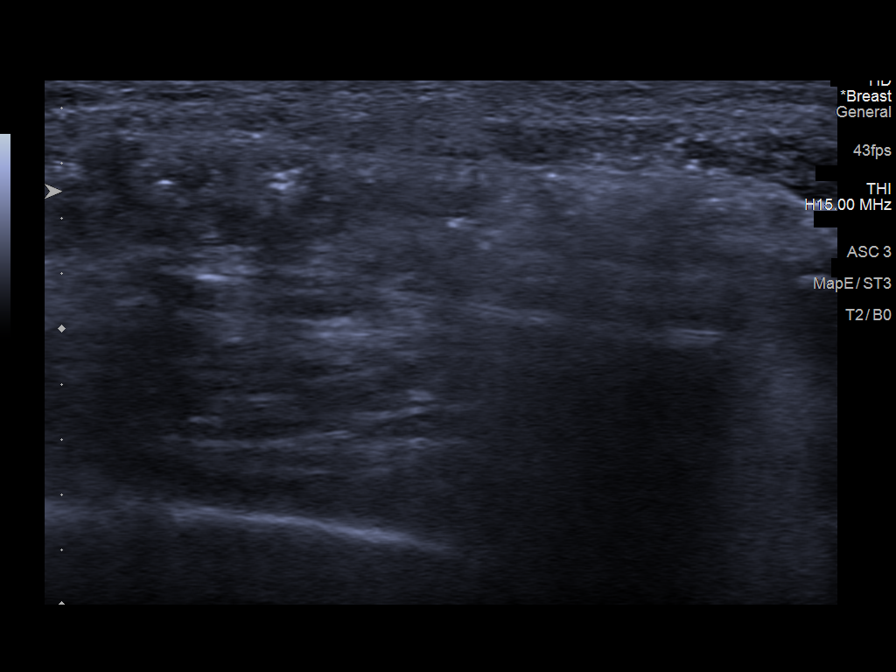
[im 19/19]
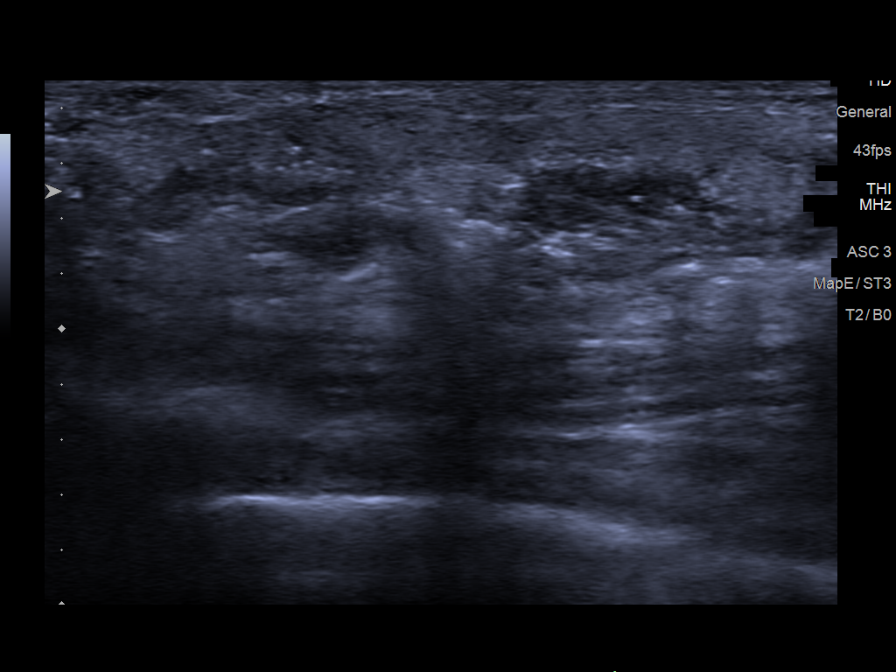

[8 of 8 positions shown; findings below may reference images not displayed]



Lesion quadrant: Lower inner quadrant

Using sterile technique and 1% Lidocaine as local anesthetic, under
direct ultrasound visualization, aspiration of the recently
demonstrated 7 mm mass in the 7 o'clock position of the left breast,
2 cm from the nipple, was attempted. However, no fluid could be
withdrawn. Therefore, a 12 gauge [employer] device was used to
perform biopsy of the 7 mm mass in the 7 o'clock position of the
left breast using a medial approach. At the conclusion of the
procedure a twisted X shaped tissue marker clip was deployed into
the biopsy cavity. Follow up 2 view mammogram was performed and
dictated separately.
IMPRESSION: Ultrasound guided biopsy of the recently demonstrated 7 mm mass in
the 7 o'clock position of the left breast. No apparent
complications.

ADDENDUM:
PATHOLOGY revealed: A. LEFT BREAST, 7:00 o'clock / 2 CMFN;
ULTRASOUND-GUIDED BIOPSY: - FIBROADENOMA WITH APOCRINE METAPLASIA. -
NEGATIVE FOR ATYPIA AND MALIGNANCY.

Pathology results are CONCORDANT with imaging findings, per Dr.
Tounssi Aisa.

Per provider request on 12/03/2020, pathology results and
recommendations will be discussed with patient by provider (Dr.
Joao Correia Forja) or his nurse ( "Danubio RN"). I spoke with Danubio
RN at provider office, and she or provider will contact patient with
results. I left patient voice message regarding biopsy site check,
and to call [HOSPITAL] if any problems or questions
arise regarding biopsy site.

Recommendation: Resume annual bilateral screening mammograms due
November 2021.

Pathology results reported by Veliyev Mrov RN on 12/03/2020.



Lesion quadrant: Lower inner quadrant

Using sterile technique and 1% Lidocaine as local anesthetic, under
direct ultrasound visualization, aspiration of the recently
demonstrated 7 mm mass in the 7 o'clock position of the left breast,
2 cm from the nipple, was attempted. However, no fluid could be
withdrawn. Therefore, a 12 gauge Tak Pui device was used to
perform biopsy of the 7 mm mass in the 7 o'clock position of the
left breast using a medial approach. At the conclusion of the
procedure a twisted X shaped tissue marker clip was deployed into
the biopsy cavity. Follow up 2 view mammogram was performed and
dictated separately.
IMPRESSION: Ultrasound guided biopsy of the recently demonstrated 7 mm mass in
the 7 o'clock position of the left breast. No apparent
complications.

## 2021-10-20 ENCOUNTER — Other Ambulatory Visit: Payer: Self-pay | Admitting: Family Medicine

## 2021-10-23 ENCOUNTER — Other Ambulatory Visit: Payer: Self-pay | Admitting: Family Medicine

## 2021-10-23 DIAGNOSIS — Z1231 Encounter for screening mammogram for malignant neoplasm of breast: Secondary | ICD-10-CM

## 2021-11-26 ENCOUNTER — Ambulatory Visit
Admission: RE | Admit: 2021-11-26 | Discharge: 2021-11-26 | Disposition: A | Discharge status: Home / Self Care | Payer: Medicare Other | Source: Ambulatory Visit | Attending: Family Medicine | Admitting: Family Medicine

## 2021-11-26 DIAGNOSIS — Z1231 Encounter for screening mammogram for malignant neoplasm of breast: Secondary | ICD-10-CM | POA: Diagnosis present

## 2022-11-02 ENCOUNTER — Other Ambulatory Visit: Payer: Self-pay | Admitting: Family Medicine

## 2022-11-02 DIAGNOSIS — Z1231 Encounter for screening mammogram for malignant neoplasm of breast: Secondary | ICD-10-CM

## 2022-11-29 ENCOUNTER — Ambulatory Visit
Admission: RE | Admit: 2022-11-29 | Discharge: 2022-11-29 | Disposition: A | Discharge status: Home / Self Care | Payer: Medicare Other | Source: Ambulatory Visit | Attending: Family Medicine | Admitting: Family Medicine

## 2022-11-29 DIAGNOSIS — Z1231 Encounter for screening mammogram for malignant neoplasm of breast: Secondary | ICD-10-CM | POA: Diagnosis present

## 2023-06-03 ENCOUNTER — Other Ambulatory Visit (HOSPITAL_COMMUNITY): Payer: Self-pay | Admitting: Orthopedic Surgery

## 2023-06-03 DIAGNOSIS — M51369 Other intervertebral disc degeneration, lumbar region without mention of lumbar back pain or lower extremity pain: Secondary | ICD-10-CM

## 2023-06-03 DIAGNOSIS — M5136 Other intervertebral disc degeneration, lumbar region: Secondary | ICD-10-CM

## 2023-06-06 ENCOUNTER — Ambulatory Visit
Admission: RE | Admit: 2023-06-06 | Discharge: 2023-06-06 | Disposition: A | Discharge status: Home / Self Care | Payer: Medicare Other | Source: Ambulatory Visit | Attending: Orthopedic Surgery | Admitting: Orthopedic Surgery

## 2023-06-06 DIAGNOSIS — M5136 Other intervertebral disc degeneration, lumbar region: Secondary | ICD-10-CM | POA: Diagnosis present

## 2023-10-17 ENCOUNTER — Other Ambulatory Visit: Payer: Self-pay | Admitting: Family Medicine

## 2023-10-17 DIAGNOSIS — Z1231 Encounter for screening mammogram for malignant neoplasm of breast: Secondary | ICD-10-CM

## 2023-11-30 ENCOUNTER — Ambulatory Visit
Admission: RE | Admit: 2023-11-30 | Discharge: 2023-11-30 | Disposition: A | Discharge status: Home / Self Care | Payer: Medicare Other | Source: Ambulatory Visit | Attending: Family Medicine | Admitting: Family Medicine

## 2023-11-30 DIAGNOSIS — Z1231 Encounter for screening mammogram for malignant neoplasm of breast: Secondary | ICD-10-CM | POA: Insufficient documentation
